# Patient Record
Sex: Female | Born: 2000
Health system: Southern US, Community
[De-identification: ages and names within clinical notes are randomized; demographics above are authoritative.]

## PROBLEM LIST (undated history)

## (undated) DIAGNOSIS — F329 Major depressive disorder, single episode, unspecified: Secondary | ICD-10-CM

## (undated) DIAGNOSIS — K5900 Constipation, unspecified: Secondary | ICD-10-CM

## (undated) DIAGNOSIS — F32A Depression, unspecified: Secondary | ICD-10-CM

## (undated) DIAGNOSIS — F9821 Rumination disorder of infancy: Secondary | ICD-10-CM

## (undated) DIAGNOSIS — F419 Anxiety disorder, unspecified: Secondary | ICD-10-CM

## (undated) HISTORY — DX: Rumination disorder of infancy and childhood: F98.21

## (undated) HISTORY — DX: Constipation, unspecified: K59.00

## (undated) HISTORY — DX: Depression, unspecified: F32.A

## (undated) HISTORY — DX: Major depressive disorder, single episode, unspecified: F32.9

## (undated) HISTORY — DX: Anxiety disorder, unspecified: F41.9

---
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
## 2021-06-03 ENCOUNTER — Other Ambulatory Visit: Payer: Self-pay | Admitting: Family

## 2021-06-03 MED ORDER — CYCLOBENZAPRINE HCL 10 MG PO TABS
ORAL_TABLET | ORAL | 0 refills | Status: DC
Start: 2021-06-03 — End: 2021-06-06

## 2021-06-06 ENCOUNTER — Other Ambulatory Visit: Payer: Self-pay

## 2021-06-06 ENCOUNTER — Ambulatory Visit (INDEPENDENT_AMBULATORY_CARE_PROVIDER_SITE_OTHER): Payer: 59 | Admitting: Pediatrics

## 2021-06-06 VITALS — Ht 65.45 in | Wt 142.0 lb

## 2021-06-06 DIAGNOSIS — Z30430 Encounter for insertion of intrauterine contraceptive device: Secondary | ICD-10-CM

## 2021-06-06 DIAGNOSIS — F4323 Adjustment disorder with mixed anxiety and depressed mood: Secondary | ICD-10-CM | POA: Diagnosis not present

## 2021-06-06 DIAGNOSIS — Z113 Encounter for screening for infections with a predominantly sexual mode of transmission: Secondary | ICD-10-CM

## 2021-06-06 DIAGNOSIS — Z3202 Encounter for pregnancy test, result negative: Secondary | ICD-10-CM

## 2021-06-06 DIAGNOSIS — Z975 Presence of (intrauterine) contraceptive device: Secondary | ICD-10-CM | POA: Insufficient documentation

## 2021-06-06 MED ORDER — LEVONORGESTREL 20 MCG/DAY IU IUD: 1.0000 | INTRAUTERINE_SYSTEM | Freq: Once | INTRAUTERINE | 0 refills | Status: DC

## 2021-06-06 MED ORDER — IBUPROFEN 200 MG PO TABS
600.0000 mg | ORAL_TABLET | Freq: Once | ORAL | Status: AC
  Administered 2021-06-06: 600 mg via ORAL

## 2021-06-06 MED ORDER — LEVONORGESTREL 20 MCG/DAY IU IUD
1.0000 | INTRAUTERINE_SYSTEM | Freq: Once | INTRAUTERINE | Status: AC
  Administered 2021-06-06: 1 via INTRAUTERINE

## 2021-06-06 MED ORDER — FLUOXETINE HCL 40 MG PO CAPS: 40.0000 mg | ORAL_CAPSULE | Freq: Every day | ORAL | 3 refills | Status: DC

## 2021-06-06 NOTE — Progress Notes (Signed)
Mirena IUD Insertion   The pt presents for Mirena IUD placement.  No contraindications for placement.   The patient took no medication prior to appt.   [facility]CG: NEG   Risks & benefits of IUD discussed  The IUD was purchased and supplied by [employer].  Packaging instructions supplied to patient  Consent form signed.  The patient denies any allergies to anesthetics or antiseptics.   Procedure:  Pt was placed in lithotomy position.  Speculum was inserted.  GC/CT swab was used to collect sample for STI testing.  Tenaculum was used to stabilize the cervix by clasping at 12 o'clock  Betadine was used to clean the cervix and cervical os.  Dilators were used. The uterus was sounded to 7 cm.  Reita Cliche was inserted using manufacturer provided applicator. Lot # [id number]  Strings were trimmed to 3 cm external to os.  Tenaculum was removed.  Speculum was removed.   The patient was advised to move slowly from a supine to an upright position   The patient denied any concerns or complaints   The patient was instructed to schedule a follow-up appt in 1 month and to call sooner if any concerns.   The patient acknowledged agreement and understanding of the plan.

## 2021-06-06 NOTE — Patient Instructions (Signed)

## 2021-06-07 LAB — POCT URINE PREGNANCY: Preg Test, Ur: NEGATIVE

## 2021-06-07 NOTE — Progress Notes (Signed)
POC urine preg resulted.

## 2021-06-08 LAB — C. TRACHOMATIS/N. GONORRHOEAE RNA
C. trachomatis RNA, TMA: NOT DETECTED
N. gonorrhoeae RNA, TMA: NOT DETECTED

## 2021-07-07 ENCOUNTER — Ambulatory Visit: Payer: 59 | Admitting: Family

## 2021-09-16 ENCOUNTER — Other Ambulatory Visit: Payer: Self-pay

## 2021-09-16 ENCOUNTER — Ambulatory Visit (HOSPITAL_COMMUNITY)
Admission: RE | Admit: 2021-09-16 | Discharge: 2021-09-16 | Disposition: A | Discharge status: Home / Self Care | Payer: 59 | Source: Ambulatory Visit | Attending: Internal Medicine | Admitting: Internal Medicine

## 2021-09-16 ENCOUNTER — Encounter (HOSPITAL_COMMUNITY): Payer: Self-pay

## 2021-09-16 VITALS — BP 126/91 | HR 82 | Temp 98.3°F | Resp 18

## 2021-09-16 DIAGNOSIS — K130 Diseases of lips: Secondary | ICD-10-CM

## 2021-09-16 DIAGNOSIS — J019 Acute sinusitis, unspecified: Secondary | ICD-10-CM | POA: Diagnosis not present

## 2021-09-16 MED ORDER — DOXYCYCLINE HYCLATE 100 MG PO CAPS: 100.0000 mg | ORAL_CAPSULE | Freq: Two times a day (BID) | ORAL | 0 refills | Status: AC

## 2021-09-16 MED ORDER — CETIRIZINE HCL 10 MG PO TABS: 10.0000 mg | ORAL_TABLET | Freq: Every day | ORAL | 0 refills | Status: AC

## 2021-09-16 MED ORDER — MUPIROCIN CALCIUM 2 % EX CREA: 1.0000 "application " | TOPICAL_CREAM | Freq: Two times a day (BID) | CUTANEOUS | 0 refills | Status: DC

## 2021-09-16 MED ORDER — MUPIROCIN CALCIUM 2 % EX CREA: TOPICAL_CREAM | Freq: Two times a day (BID) | CUTANEOUS | Status: DC

## 2021-09-16 NOTE — ED Triage Notes (Signed)
Pt is present today with cough, chest/nasal congestion, fatigue, and HA. Pt sx started x2 weeks ago.

## 2021-09-16 NOTE — Discharge Instructions (Signed)
Please take medications as prescribed Continue Flonase use Add Zyrtec to your treatment regimen Maintain adequate hydration If symptoms worsen please return to urgent care to be reevaluated.

## 2021-09-17 NOTE — ED Provider Notes (Signed)
[facility]    CSN: [id number] Arrival date & time: 09/16/21  1639      History   Chief Complaint Chief Complaint  Patient presents with   Cough   Nasal Congestion   fever blister   Headache    HPI Angelica Valencia is a 20 y.o. female comes to the urgent care with a 2-week history of nasal congestion, cough, fatigue and headache.  Patient's symptoms started 2 weeks ago when symptoms have improved except for cough, nasal congestion and fatigue.  Patient is also developed a rash on the lower lip.  Rash is surrounded by an erythematous patch.  No pain or itching.  Patient has been taking at the rash.  No abdominal pain or nausea or vomiting.  Patient lives in an apartment complex.  No history of seasonal allergies.  No wheezing.  Patient endorses nasal congestion, purulent nasal discharge as well as postnasal drip.  Patient also complains of facial pain.  No fever or chills.  HPI  Past Medical History:  Diagnosis Date   Anxiety and depression    Constipation    Disordered eating 06/01/2017   Rumination disorder     Patient Active Problem List   Diagnosis Date Noted   IUD (intrauterine device) in place 06/06/2021   Adjustment disorder with mixed anxiety and depressed mood 06/01/2017   Constipation 03/28/2016    History reviewed. No pertinent surgical history.  OB History   No obstetric history on file.      Home Medications    Prior to Admission medications   Medication Sig Start Date End Date Taking? Authorizing Provider  cetirizine (ZYRTEC ALLERGY) 10 MG tablet Take 1 tablet (10 mg total) by mouth daily. 09/16/21  Yes Macsen Nuttall, Britta Mccreedy, MD  doxycycline (VIBRAMYCIN) 100 MG capsule Take 1 capsule (100 mg total) by mouth 2 (two) times daily for 7 days. 09/16/21 09/23/21 Yes Raysa Bosak, Britta Mccreedy, MD  mupirocin cream (BACTROBAN) 2 % Apply 1 application topically 2 (two) times daily. 09/16/21  Yes Meia Emley, Britta Mccreedy, MD  b complex vitamins capsule Take 1 capsule by  mouth daily.    [provider]  FLUoxetine (PROZAC) 40 MG capsule Take 1 capsule (40 mg total) by mouth daily. 06/06/21   Owens Shark, MD  Multiple Vitamin (MULTIVITAMIN) tablet Take 1 tablet by mouth daily.    [provider]  Probiotic Product (PROBIOTIC-10 PO) Take by mouth.    [provider]    Family History Family History  Problem Relation Age of Onset   Anxiety disorder Mother    Hashimoto's thyroiditis Paternal Aunt     Social History Social History   Tobacco Use   Smoking status: Never   Smokeless tobacco: Never  Vaping Use   Vaping Use: Never used  Substance Use Topics   Alcohol use: No    Alcohol/week: 0.0 standard drinks   Drug use: No     Allergies   Penicillins   Review of Systems Review of Systems As per HPI  Physical Exam Triage Vital Signs ED Triage Vitals  Enc Vitals Group     BP 09/16/21 1735 (!) 126/91     Pulse Rate 09/16/21 1735 82     Resp 09/16/21 1735 18     Temp 09/16/21 1735 98.3 F (36.8 C)     Temp Source 09/16/21 1735 Oral     SpO2 09/16/21 1735 98 %     Weight --      Height --  Head Circumference --      Peak Flow --      Pain Score 09/16/21 1734 0     Pain Loc --      Pain Edu? --      Excl. in [place]? --    No data found.  Updated Vital Signs BP (!) 126/91 (BP Location: Right Arm)   Pulse 82   Temp 98.3 F (36.8 C) (Oral)   Resp 18   SpO2 98%   Visual Acuity Right Eye Distance:   Left Eye Distance:   Bilateral Distance:    Right Eye Near:   Left Eye Near:    Bilateral Near:     Physical Exam Vitals and nursing note reviewed.  Constitutional:      General: She is not in acute distress.    Appearance: She is well-developed. She is not ill-appearing.  Cardiovascular:     Rate and Rhythm: Normal rate and regular rhythm.     Heart sounds: Normal heart sounds. No murmur heard.   No friction rub.  Pulmonary:     Effort: Pulmonary effort is normal. No respiratory distress.      Breath sounds: No wheezing or rhonchi.  Abdominal:     General: Bowel sounds are normal.     Palpations: Abdomen is soft. There is no mass.     Tenderness: There is no abdominal tenderness.  Neurological:     Mental Status: She is alert.     GCS: GCS eye subscore is 4. GCS verbal subscore is 5. GCS motor subscore is 6.     UC Treatments / Results  Labs (all labs ordered are listed, but only abnormal results are displayed) Labs Reviewed - No data to display  EKG   Radiology No results found.  Procedures Procedures (including critical care time)  Medications Ordered in UC Medications - No data to display  Initial Impression / Assessment and Plan / UC Course  I have reviewed the triage vital signs and the nursing notes.  Pertinent labs & imaging results that were available during my care of the patient were reviewed by me and considered in my medical decision making (see chart for details).     1.  Cellulitis of the skin of lower lip: Doxycycline 100 mg twice daily for 7 days Mupirocin cream twice daily for 5 days If symptoms worsen please return to urgent care to be reevaluated  2.  Acute sinusitis with symptoms greater than 10 days: Antibiotics as above Flonase use recommended Humidifier use recommended Zyrtec once daily Return precautions given Final Clinical Impressions(s) / UC Diagnoses   Final diagnoses:  Cellulitis of skin of lip  Acute sinusitis with symptoms greater than 10 days     Discharge Instructions      Please take medications as prescribed Continue Flonase use Add Zyrtec to your treatment regimen Maintain adequate hydration If symptoms worsen please return to urgent care to be reevaluated.   ED Prescriptions     Medication Sig Dispense Auth. Provider   doxycycline (VIBRAMYCIN) 100 MG capsule Take 1 capsule (100 mg total) by mouth 2 (two) times daily for 7 days. 14 capsule Nachmen Mansel, Britta Mccreedy, MD   mupirocin cream (BACTROBAN) 2 % Apply  1 application topically 2 (two) times daily. 15 g Merrilee Jansky, MD   cetirizine (ZYRTEC ALLERGY) 10 MG tablet Take 1 tablet (10 mg total) by mouth daily. 60 tablet Erico Stan, Britta Mccreedy, MD      PDMP not reviewed this  encounter.   Merrilee Jansky, MD 09/17/21 [phone]

## 2022-02-15 ENCOUNTER — Other Ambulatory Visit: Payer: Self-pay | Admitting: Pediatrics

## 2022-02-15 DIAGNOSIS — F4323 Adjustment disorder with mixed anxiety and depressed mood: Secondary | ICD-10-CM

## 2022-02-15 MED ORDER — FLUOXETINE HCL 40 MG PO CAPS: 40.0000 mg | ORAL_CAPSULE | Freq: Every day | ORAL | 3 refills | Status: DC

## 2022-02-21 ENCOUNTER — Other Ambulatory Visit: Payer: Self-pay | Admitting: Pediatrics

## 2022-02-21 DIAGNOSIS — F4323 Adjustment disorder with mixed anxiety and depressed mood: Secondary | ICD-10-CM

## 2022-02-21 MED ORDER — FLUOXETINE HCL 40 MG PO CAPS: 40.0000 mg | ORAL_CAPSULE | Freq: Every day | ORAL | 3 refills | Status: DC

## 2022-11-27 ENCOUNTER — Other Ambulatory Visit: Payer: Self-pay

## 2022-11-27 ENCOUNTER — Ambulatory Visit (HOSPITAL_COMMUNITY)
Admission: EM | Admit: 2022-11-27 | Discharge: 2022-11-27 | Disposition: A | Discharge status: Home / Self Care | Payer: 59 | Attending: Internal Medicine | Admitting: Internal Medicine

## 2022-11-27 ENCOUNTER — Encounter (HOSPITAL_COMMUNITY): Payer: Self-pay | Admitting: *Deleted

## 2022-11-27 DIAGNOSIS — R1030 Lower abdominal pain, unspecified: Secondary | ICD-10-CM | POA: Diagnosis present

## 2022-11-27 DIAGNOSIS — N3001 Acute cystitis with hematuria: Secondary | ICD-10-CM | POA: Diagnosis present

## 2022-11-27 LAB — POC URINE PREG, ED: Preg Test, Ur: NEGATIVE

## 2022-11-27 LAB — POCT URINALYSIS DIPSTICK, ED / UC
Bilirubin Urine: NEGATIVE
Glucose, UA: NEGATIVE mg/dL
Ketones, ur: NEGATIVE mg/dL
Nitrite: NEGATIVE
Protein, ur: 100 mg/dL — AB
Specific Gravity, Urine: 1.025 (ref 1.005–1.030)
Urobilinogen, UA: 0.2 mg/dL (ref 0.0–1.0)
pH: 7 (ref 5.0–8.0)

## 2022-11-27 MED ORDER — NITROFURANTOIN MONOHYD MACRO 100 MG PO CAPS: 100.0000 mg | ORAL_CAPSULE | Freq: Two times a day (BID) | ORAL | 0 refills | Status: DC

## 2022-11-27 NOTE — Discharge Instructions (Signed)
We are treating you for urinary tract infection.  Please take nitrofurantoin twice daily for 5 days.  This can change the color of your urine to a brown so do not be alarmed if you see this.  We are sending your urine for culture and we will contact you if we need to stop or change your antibiotics.  Will also contact you with your swab results if we need to arrange any treatment.  Make sure you rest and drink plenty of fluid.  If your symptoms worsen in any way and you have abdominal pain, pelvic pain, fever, nausea, vomiting, back pain, weakness you should be seen immediately.  Follow-up with your primary care in 2 to 4 weeks to have your urine rechecked once we have treated the infection and ensure that the blood we noted today goes away once we clear the infection.

## 2022-11-27 NOTE — ED Triage Notes (Signed)
Pt reports Sx's started 2 days ago with dysuria and frequency.

## 2022-11-27 NOTE — ED Provider Notes (Signed)
Angelica Valencia    CSN: [id number] Arrival date & time: 11/27/22  1929      History   Chief Complaint Chief Complaint  Patient presents with   Dysuria   Urinary Frequency    HPI Angelica Valencia is a 22 y.o. female.   Patient presents today with a 2-day history of UTI symptoms including dysuria and urinary frequency.  She reports associated cloudy urine, malodorous urine, lower abdominal pain.  Denies any hematuria, pelvic pain, fever, nausea, vomiting.  She does report some vaginal discharge which she describes as gray.  She is no specific concern for STI but is sexually active.  She denies any recent antibiotic use.  She denies history of recurrent UTI, nephrolithiasis, single kidney, self-catheterization, recent urogenital procedure.  She has not seen a urologist in the past.  She does not believe she could be pregnant.  She denies history of diabetes and does not take SGLT2 inhibitor.    Past Medical History:  Diagnosis Date   Anxiety and depression    Constipation    Disordered eating 06/01/2017   Rumination disorder     Patient Active Problem List   Diagnosis Date Noted   IUD (intrauterine device) in place 06/06/2021   Adjustment disorder with mixed anxiety and depressed mood 06/01/2017   Constipation 03/28/2016    History reviewed. No pertinent surgical history.  OB History   No obstetric history on file.      Home Medications    Prior to Admission medications   Medication Sig Start Date End Date Taking? Authorizing Provider  nitrofurantoin, macrocrystal-monohydrate, (MACROBID) 100 MG capsule Take 1 capsule (100 mg total) by mouth 2 (two) times daily. 11/27/22  Yes Jalesia Loudenslager K, PA-C  b complex vitamins capsule Take 1 capsule by mouth daily.    [provider]  cetirizine (ZYRTEC ALLERGY) 10 MG tablet Take 1 tablet (10 mg total) by mouth daily. 09/16/21   Chase Picket, MD  FLUoxetine (PROZAC) 40 MG capsule Take 1 capsule (40 mg total) by  mouth daily. 02/21/22   Trude Mcburney, FNP  Multiple Vitamin (MULTIVITAMIN) tablet Take 1 tablet by mouth daily.    [provider]  mupirocin cream (BACTROBAN) 2 % Apply 1 application topically 2 (two) times daily. 09/16/21   LampteyMyrene Galas, MD  Probiotic Product (PROBIOTIC-10 PO) Take by mouth.    [provider]    Family History Family History  Problem Relation Age of Onset   Anxiety disorder Mother    Hashimoto's thyroiditis Paternal Aunt     Social History Social History   Tobacco Use   Smoking status: Never   Smokeless tobacco: Never  Vaping Use   Vaping Use: Never used  Substance Use Topics   Alcohol use: No    Alcohol/week: 0.0 standard drinks of alcohol   Drug use: No     Allergies   Penicillins   Review of Systems Review of Systems  Constitutional:  Positive for activity change. Negative for appetite change and fatigue.  Gastrointestinal:  Positive for abdominal pain. Negative for diarrhea, nausea and vomiting.  Genitourinary:  Positive for dysuria, frequency and vaginal discharge. Negative for hematuria, pelvic pain, urgency, vaginal bleeding and vaginal pain.     Physical Exam Triage Vital Signs ED Triage Vitals  Enc Vitals Group     BP 11/27/22 1951 (!) 147/87     Pulse Rate 11/27/22 1951 77     Resp 11/27/22 1951 18     Temp  11/27/22 1951 99.3 F (37.4 C)     Temp src --      SpO2 11/27/22 1951 96 %     Weight --      Height --      Head Circumference --      Peak Flow --      Pain Score 11/27/22 1949 8     Pain Loc --      Pain Edu? --      Excl. in [place]? --    No data found.  Updated Vital Signs BP (!) 147/87   Pulse 77   Temp 99.3 F (37.4 C)   Resp 18   LMP 11/07/2022   SpO2 96%   Visual Acuity Right Eye Distance:   Left Eye Distance:   Bilateral Distance:    Right Eye Near:   Left Eye Near:    Bilateral Near:     Physical Exam Vitals reviewed.  Constitutional:      General: She is awake. She  is not in acute distress.    Appearance: Normal appearance. She is well-developed. She is not ill-appearing.     Comments: Very pleasant female appears stated age in no acute distress sitting comfortably in exam room  HENT:     Head: Normocephalic and atraumatic.  Cardiovascular:     Rate and Rhythm: Normal rate and regular rhythm.     Heart sounds: Normal heart sounds, S1 normal and S2 normal. No murmur heard. Pulmonary:     Effort: Pulmonary effort is normal.     Breath sounds: Normal breath sounds. No wheezing, rhonchi or rales.     Comments: Clear to auscultation bilaterally Abdominal:     General: Bowel sounds are normal.     Palpations: Abdomen is soft.     Tenderness: There is no abdominal tenderness. There is no right CVA tenderness, left CVA tenderness, guarding or rebound.     Comments: Benign abdominal exam  Genitourinary:    Comments: Exam deferred Psychiatric:        Behavior: Behavior is cooperative.      UC Treatments / Results  Labs (all labs ordered are listed, but only abnormal results are displayed) Labs Reviewed  POCT URINALYSIS DIPSTICK, ED / UC - Abnormal; Notable for the following components:      Result Value   Hgb urine dipstick LARGE (*)    Protein, ur 100 (*)    Leukocytes,Ua SMALL (*)    All other components within normal limits  URINE CULTURE  POC URINE PREG, ED  CERVICOVAGINAL ANCILLARY ONLY    EKG   Radiology No results found.  Procedures Procedures (including critical care time)  Medications Ordered in UC Medications - No data to display  Initial Impression / Assessment and Plan / UC Course  I have reviewed the triage vital signs and the nursing notes.  Pertinent labs & imaging results that were available during my care of the patient were reviewed by me and considered in my medical decision making (see chart for details).     Patient is well-appearing, afebrile, nontoxic, nontachycardic.  Urine pregnancy was negative in  clinic today.  UA showed hemoglobin and leukocyte Estrace consistent with UTI.  Will empirically treat with nitrofurantoin.  Urine culture was obtained and is pending.  Discussed that we will contact her if we need to change or adjust her treatment based on sensitivities identified on culture.  STI swab was collected given associated vaginal discharge and is pending.  Discussed  that given hemoglobin and urine she should follow-up with her primary care in 2 to 4 weeks to have this rechecked and ensure that hemoglobin noted today resolved with clearing of infection.  She is to push fluids and rest.  Discussed that if she has any worsening or changing symptoms including pelvic pain, abdominal pain, fever, nausea, vomiting, hematuria, weakness she should be seen immediately.  Strict return precautions given to which she expressed understanding.  Final Clinical Impressions(s) / UC Diagnoses   Final diagnoses:  Acute cystitis with hematuria  Lower abdominal pain     Discharge Instructions      We are treating you for urinary tract infection.  Please take nitrofurantoin twice daily for 5 days.  This can change the color of your urine to a brown so do not be alarmed if you see this.  We are sending your urine for culture and we will contact you if we need to stop or change your antibiotics.  Will also contact you with your swab results if we need to arrange any treatment.  Make sure you rest and drink plenty of fluid.  If your symptoms worsen in any way and you have abdominal pain, pelvic pain, fever, nausea, vomiting, back pain, weakness you should be seen immediately.  Follow-up with your primary care in 2 to 4 weeks to have your urine rechecked once we have treated the infection and ensure that the blood we noted today goes away once we clear the infection.     ED Prescriptions     Medication Sig Dispense Auth. Provider   nitrofurantoin, macrocrystal-monohydrate, (MACROBID) 100 MG capsule Take 1  capsule (100 mg total) by mouth 2 (two) times daily. 10 capsule Harriette Tovey, Derry Skill, PA-C      PDMP not reviewed this encounter.   Terrilee Croak, PA-C 11/27/22 2012

## 2022-11-28 LAB — CERVICOVAGINAL ANCILLARY ONLY
Bacterial Vaginitis (gardnerella): NEGATIVE
Candida Glabrata: NEGATIVE
Candida Vaginitis: NEGATIVE
Chlamydia: NEGATIVE
Comment: NEGATIVE
Comment: NEGATIVE
Comment: NEGATIVE
Comment: NEGATIVE
Comment: NEGATIVE
Comment: NORMAL
Neisseria Gonorrhea: NEGATIVE
Trichomonas: NEGATIVE

## 2022-11-29 LAB — URINE CULTURE: Culture: 10000 — AB

## 2023-01-19 ENCOUNTER — Other Ambulatory Visit: Payer: Self-pay

## 2023-01-19 ENCOUNTER — Emergency Department (HOSPITAL_COMMUNITY)
Admission: EM | Admit: 2023-01-19 | Discharge: 2023-01-19 | Disposition: A | Discharge status: Home / Self Care | Payer: 59 | Attending: Emergency Medicine | Admitting: Emergency Medicine

## 2023-01-19 DIAGNOSIS — R7309 Other abnormal glucose: Secondary | ICD-10-CM | POA: Insufficient documentation

## 2023-01-19 DIAGNOSIS — F10920 Alcohol use, unspecified with intoxication, uncomplicated: Secondary | ICD-10-CM | POA: Insufficient documentation

## 2023-01-19 DIAGNOSIS — R4182 Altered mental status, unspecified: Secondary | ICD-10-CM | POA: Diagnosis present

## 2023-01-19 LAB — CBC WITH DIFFERENTIAL/PLATELET
Abs Immature Granulocytes: 0.04 10*3/uL (ref 0.00–0.07)
Basophils Absolute: 0 10*3/uL (ref 0.0–0.1)
Basophils Relative: 0 %
Eosinophils Absolute: 0.2 10*3/uL (ref 0.0–0.5)
Eosinophils Relative: 2 %
HCT: 41.1 % (ref 36.0–46.0)
Hemoglobin: 14.2 g/dL (ref 12.0–15.0)
Immature Granulocytes: 0 %
Lymphocytes Relative: 44 %
Lymphs Abs: 4.6 10*3/uL — ABNORMAL HIGH (ref 0.7–4.0)
MCH: 31.6 pg (ref 26.0–34.0)
MCHC: 34.5 g/dL (ref 30.0–36.0)
MCV: 91.3 fL (ref 80.0–100.0)
Monocytes Absolute: 0.7 10*3/uL (ref 0.1–1.0)
Monocytes Relative: 7 %
Neutro Abs: 4.8 10*3/uL (ref 1.7–7.7)
Neutrophils Relative %: 47 %
Platelets: 339 10*3/uL (ref 150–400)
RBC: 4.5 MIL/uL (ref 3.87–5.11)
RDW: 11.9 % (ref 11.5–15.5)
WBC: 10.4 10*3/uL (ref 4.0–10.5)
nRBC: 0 % (ref 0.0–0.2)

## 2023-01-19 LAB — I-STAT BETA HCG BLOOD, ED (MC, WL, AP ONLY): I-stat hCG, quantitative: <5 m[IU]/mL (ref <5)

## 2023-01-19 LAB — COMPREHENSIVE METABOLIC PANEL
ALT: 16 U/L (ref 0–44)
AST: 24 U/L (ref 15–41)
Albumin: 4.5 g/dL (ref 3.5–5.0)
Alkaline Phosphatase: 44 U/L (ref 38–126)
Anion gap: 14 (ref 5–15)
BUN: 8 mg/dL (ref 6–20)
CO2: 22 mmol/L (ref 22–32)
Calcium: 9.1 mg/dL (ref 8.9–10.3)
Chloride: 103 mmol/L (ref 98–111)
Creatinine, Ser: 0.74 mg/dL (ref 0.44–1.00)
GFR, Estimated: >60 mL/min (ref >60)
Glucose, Bld: 111 mg/dL — ABNORMAL HIGH (ref 70–99)
Potassium: 3.6 mmol/L (ref 3.5–5.1)
Sodium: 139 mmol/L (ref 135–145)
Total Bilirubin: 0.7 mg/dL (ref 0.3–1.2)
Total Protein: 7.8 g/dL (ref 6.5–8.1)

## 2023-01-19 LAB — SALICYLATE LEVEL: Salicylate Lvl: <7 mg/dL — ABNORMAL LOW (ref 7.0–30.0)

## 2023-01-19 LAB — I-STAT CHEM 8, ED
BUN: 8 mg/dL (ref 6–20)
Calcium, Ion: 1.06 mmol/L — ABNORMAL LOW (ref 1.15–1.40)
Chloride: 106 mmol/L (ref 98–111)
Creatinine, Ser: 1.2 mg/dL — ABNORMAL HIGH (ref 0.44–1.00)
Glucose, Bld: 108 mg/dL — ABNORMAL HIGH (ref 70–99)
HCT: 41 % (ref 36.0–46.0)
Hemoglobin: 13.9 g/dL (ref 12.0–15.0)
Potassium: 3.6 mmol/L (ref 3.5–5.1)
Sodium: 143 mmol/L (ref 135–145)
TCO2: 22 mmol/L (ref 22–32)

## 2023-01-19 LAB — RAPID URINE DRUG SCREEN, HOSP PERFORMED
Amphetamines: NOT DETECTED
Barbiturates: NOT DETECTED
Benzodiazepines: NOT DETECTED
Cocaine: UNDETERMINED — AB
Opiates: NOT DETECTED
Tetrahydrocannabinol: POSITIVE — AB

## 2023-01-19 LAB — ACETAMINOPHEN LEVEL: Acetaminophen (Tylenol), Serum: <10 ug/mL — ABNORMAL LOW (ref 10–30)

## 2023-01-19 LAB — ETHANOL: Alcohol, Ethyl (B): 381 mg/dL (ref <10)

## 2023-01-19 MED ORDER — NALOXONE HCL 2 MG/2ML IJ SOSY
PREFILLED_SYRINGE | INTRAMUSCULAR | Status: AC
  Administered 2023-01-19: 2 mg via INTRAVENOUS
  Filled 2023-01-19: qty 2

## 2023-01-19 MED ORDER — NALOXONE HCL 2 MG/2ML IJ SOSY: 2.0000 mg | PREFILLED_SYRINGE | Freq: Once | INTRAMUSCULAR | Status: AC

## 2023-01-19 MED ORDER — SODIUM CHLORIDE 0.9 % IV BOLUS
1000.0000 mL | Freq: Once | INTRAVENOUS | Status: AC
  Administered 2023-01-19: 1000 mL via INTRAVENOUS

## 2023-01-19 NOTE — ED Provider Notes (Addendum)
[facility] Provider Note   CSN: [id number] Arrival date & time: 01/19/23  0147     History  No chief complaint on file.   Kristyn Liaw is a 22 y.o. female.  History provided by: Triage nurse. The history is limited by the condition of the patient (unresponsive).  She has history of anxiety and depression and was dropped off at triage by friends who immediately left, patient was unresponsive and unable to give any history.   Home Medications Prior to Admission medications   Medication Sig Start Date End Date Taking? Authorizing Provider  b complex vitamins capsule Take 1 capsule by mouth daily.    [provider]  cetirizine (ZYRTEC ALLERGY) 10 MG tablet Take 1 tablet (10 mg total) by mouth daily. 09/16/21   Chase Picket, MD  FLUoxetine (PROZAC) 40 MG capsule Take 1 capsule (40 mg total) by mouth daily. 02/21/22   Trude Mcburney, FNP  Multiple Vitamin (MULTIVITAMIN) tablet Take 1 tablet by mouth daily.    [provider]  mupirocin cream (BACTROBAN) 2 % Apply 1 application topically 2 (two) times daily. 09/16/21   LampteyMyrene Galas, MD  nitrofurantoin, macrocrystal-monohydrate, (MACROBID) 100 MG capsule Take 1 capsule (100 mg total) by mouth 2 (two) times daily. 11/27/22   Raspet, Derry Skill, PA-C  Probiotic Product (PROBIOTIC-10 PO) Take by mouth.    [provider]      Allergies    Penicillins    Review of Systems   Review of Systems  Unable to perform ROS: Patient unresponsive    Physical Exam Updated Vital Signs BP 104/69 (BP Location: Right Arm)   Pulse 91   Temp 97.9 F (36.6 C) (Temporal)   Resp 14   SpO2 96%  Physical Exam Vitals and nursing note reviewed.   22 year old female only responsive to painful stimuli. Vital signs are normal. Oxygen saturation is 96%, which is normal. Head is normocephalic and atraumatic.  Pupils are 7 mm and reactive. Oropharynx is clear. Neck is supple  without adenopathy or JVD. Lungs are clear without rales, wheezes, or rhonchi. Chest moves symmetrically. Heart has regular rate and rhythm without murmur. Abdomen is soft, flat. Extremities have no cyanosis or edema, full range of motion is present. Skin is warm and dry without rash. Neurologic: Responds to painful stimuli with somewhat purposeful movement, moves all extremities equally.  ED Results / Procedures / Treatments   Labs (all labs ordered are listed, but only abnormal results are displayed) Labs Reviewed  COMPREHENSIVE METABOLIC PANEL - Abnormal; Notable for the following components:      Result Value   Glucose, Bld 111 (*)    All other components within normal limits  ETHANOL - Abnormal; Notable for the following components:   Alcohol, Ethyl (B) 381 (*)    All other components within normal limits  CBC WITH DIFFERENTIAL/PLATELET - Abnormal; Notable for the following components:   Lymphs Abs 4.6 (*)    All other components within normal limits  RAPID URINE DRUG SCREEN, HOSP PERFORMED - Abnormal; Notable for the following components:   Cocaine QUANTITY NOT SUFFICIENT, UNABLE TO PERFORM TEST (*)    Tetrahydrocannabinol POSITIVE (*)    All other components within normal limits  SALICYLATE LEVEL - Abnormal; Notable for the following components:   Salicylate Lvl [id number] (*)    All other components within normal limits  ACETAMINOPHEN LEVEL - Abnormal; Notable for the following components:   Acetaminophen (  Tylenol), Serum <10 (*)    All other components within normal limits  I-STAT CHEM 8, ED - Abnormal; Notable for the following components:   Creatinine, Ser 1.20 (*)    Glucose, Bld 108 (*)    Calcium, Ion 1.06 (*)    All other components within normal limits  I-STAT BETA HCG BLOOD, ED ([facility], [facility], [facility] ONLY)    EKG EKG Interpretation  Date/Time:  Friday January 19 2023 01:55:31 EDT Ventricular Rate:  74 PR Interval:  131 QRS Duration: 76 QT Interval:  387 QTC  Calculation: 430 R Axis:   84 Text Interpretation: Sinus rhythm Normal ECG No old tracing to compare Confirmed by Delora Fuel ([id number]) on 01/19/2023 2:05:13 AM  Procedures Procedures  Cardiac monitor shows normal sinus rhythm, per my interpretation.  Medications Ordered in ED Medications  naloxone [facility]) injection 2 mg (2 mg Intravenous Given 01/19/23 0204)  sodium chloride 0.9 % bolus 1,000 mL (1,000 mLs Intravenous New Bag/Given 01/19/23 0204)    ED Course/ Medical Decision Making/ A&P                             Medical Decision Making Amount and/or Complexity of Data Reviewed Labs: ordered.  Risk Prescription drug management.   Altered mental status in a otherwise healthy young person.  Dilated pupils make opiate overdose unlikely.  Likely other intoxicants such as alcohol.  When she was challenged with ammonia and how it, she woke up and started sneezing.  She did have a episode where she seemed to have brief apnea with hypoxia, but respirations resumed spontaneously and she has been maintaining good oxygen saturation since then.  I have reviewed her past records, and see no relevant past visits.  I have ordered IV fluids, laboratory workup including CBC, comprehensive metabolic panel, ethanol level, acetaminophen level, salicylate level, urine drug screen, pregnancy test.  Even though pupils were dilated, I did order a dose of naloxone as a trial, there was no response to that.  I have reviewed and interpreted her laboratory test, and my interpretation is mild elevation of random glucose which will need to be followed as an outpatient, markedly elevated ethanol level consistent with alcohol intoxication, urine drug screen positive for THC.  Clinically, patient had severe alcohol intoxication.  I have observed her in the emergency department and she has gradually become awake and conversant although still obviously intoxicated.  Family has arrived and feel comfortable taking her home.   She has been able to ambulate without any difficulty.  I have advised her to limit her alcohol consumption.  CRITICAL CARE Performed by: Delora Fuel Total critical care time: 45 minutes Critical care time was exclusive of separately billable procedures and treating other patients. Critical care was necessary to treat or prevent imminent or life-threatening deterioration. Critical care was time spent personally by me on the following activities: development of treatment plan with patient and/or surrogate as well as nursing, discussions with consultants, evaluation of patient's response to treatment, examination of patient, obtaining history from patient or surrogate, ordering and performing treatments and interventions, ordering and review of laboratory studies, ordering and review of radiographic studies, pulse oximetry and re-evaluation of patient's condition.  Final Clinical Impression(s) / ED Diagnoses Final diagnoses:  Alcohol intoxication, uncomplicated ([place])  Elevated random blood glucose level    Rx / DC Orders ED Discharge Orders     None         Roxanne Mins,  Shanon Brow, MD [id number]    Delora Fuel, MD [phone]

## 2023-01-19 NOTE — Discharge Instructions (Addendum)
You had way too much alcohol to drink tonight.  Please limit your alcohol to no more than 2 drinks in a day.  1 drink is a shot of whiskey, glass of wine, or a can of beer.

## 2023-01-19 NOTE — ED Notes (Signed)
Pt a/o x 4 at this time. GCS 15

## 2023-01-19 NOTE — ED Triage Notes (Signed)
Pt arrives to ED POV dropped off unresponsive

## 2023-01-19 NOTE — ED Notes (Signed)
Pt able to ambulate with no difficulty

## 2023-05-21 ENCOUNTER — Ambulatory Visit: Payer: 59 | Admitting: Internal Medicine

## 2023-05-21 ENCOUNTER — Encounter: Payer: Self-pay | Admitting: Internal Medicine

## 2023-05-21 VITALS — BP 112/80 | HR 72 | Temp 99.0°F | Ht 65.45 in | Wt 133.0 lb

## 2023-05-21 DIAGNOSIS — F4323 Adjustment disorder with mixed anxiety and depressed mood: Secondary | ICD-10-CM | POA: Diagnosis not present

## 2023-05-21 DIAGNOSIS — Z13220 Encounter for screening for lipoid disorders: Secondary | ICD-10-CM | POA: Diagnosis not present

## 2023-05-21 DIAGNOSIS — Z975 Presence of (intrauterine) contraceptive device: Secondary | ICD-10-CM | POA: Diagnosis not present

## 2023-05-21 DIAGNOSIS — Z113 Encounter for screening for infections with a predominantly sexual mode of transmission: Secondary | ICD-10-CM

## 2023-05-21 DIAGNOSIS — Z0000 Encounter for general adult medical examination without abnormal findings: Secondary | ICD-10-CM

## 2023-05-21 LAB — LIPID PANEL
Cholesterol: 232 mg/dL — ABNORMAL HIGH (ref 0–200)
HDL: 72.3 mg/dL (ref >39.00)
LDL Cholesterol: 139 mg/dL — ABNORMAL HIGH (ref 0–99)
NonHDL: 159.2
Total CHOL/HDL Ratio: 3
Triglycerides: 103 mg/dL (ref 0.0–149.0)
VLDL: 20.6 mg/dL (ref 0.0–40.0)

## 2023-05-21 LAB — CBC
HCT: 43.7 % (ref 36.0–46.0)
Hemoglobin: 14.9 g/dL (ref 12.0–15.0)
MCHC: 34 g/dL (ref 30.0–36.0)
MCV: 93.7 fl (ref 78.0–100.0)
Platelets: 375 10*3/uL (ref 150.0–400.0)
RBC: 4.67 Mil/uL (ref 3.87–5.11)
RDW: 12.5 % (ref 11.5–15.5)
WBC: 7.9 10*3/uL (ref 4.0–10.5)

## 2023-05-21 LAB — COMPREHENSIVE METABOLIC PANEL
ALT: 19 U/L (ref 0–35)
AST: 27 U/L (ref 0–37)
Albumin: 5 g/dL (ref 3.5–5.2)
Alkaline Phosphatase: 48 U/L (ref 39–117)
BUN: 13 mg/dL (ref 6–23)
CO2: 26 mEq/L (ref 19–32)
Calcium: 10.3 mg/dL (ref 8.4–10.5)
Chloride: 99 mEq/L (ref 96–112)
Creatinine, Ser: 0.72 mg/dL (ref 0.40–1.20)
GFR: 119.57 mL/min (ref >60.00)
Glucose, Bld: 91 mg/dL (ref 70–99)
Potassium: 3.7 mEq/L (ref 3.5–5.1)
Sodium: 134 mEq/L — ABNORMAL LOW (ref 135–145)
Total Bilirubin: 1.4 mg/dL — ABNORMAL HIGH (ref 0.2–1.2)
Total Protein: 8.2 g/dL (ref 6.0–8.3)

## 2023-05-21 MED ORDER — FLUOXETINE HCL 40 MG PO CAPS: 40.0000 mg | ORAL_CAPSULE | Freq: Every day | ORAL | 3 refills | Status: DC

## 2023-05-21 NOTE — Assessment & Plan Note (Signed)
Doing well and controlled with prozac 40 mg daily. We will continue and she does not desire change to dose at this time. Checking CMP and CBC and lipid panel for screening.

## 2023-05-21 NOTE — Progress Notes (Signed)
   Subjective:   Patient ID: Angelica Valencia, female    DOB: 09-22-2001, 22 y.o.   MRN: [id number]  HPI The patient is a new 22 YO female coming in for follow up care (see A/P for details) and desires physical as well.   PMH, [facility], social history reviewed and updated  Review of Systems  Constitutional: Negative.   HENT: Negative.    Eyes: Negative.   Respiratory:  Negative for cough, chest tightness and shortness of breath.   Cardiovascular:  Negative for chest pain, palpitations and leg swelling.  Gastrointestinal:  Negative for abdominal distention, abdominal pain, constipation, diarrhea, nausea and vomiting.  Musculoskeletal: Negative.   Skin: Negative.   Neurological: Negative.   Psychiatric/Behavioral: Negative.      Objective:  Physical Exam Constitutional:      Appearance: She is well-developed.  HENT:     Head: Normocephalic and atraumatic.  Cardiovascular:     Rate and Rhythm: Normal rate and regular rhythm.  Pulmonary:     Effort: Pulmonary effort is normal. No respiratory distress.     Breath sounds: Normal breath sounds. No wheezing or rales.  Abdominal:     General: Bowel sounds are normal. There is no distension.     Palpations: Abdomen is soft.     Tenderness: There is no abdominal tenderness. There is no rebound.  Musculoskeletal:     Cervical back: Normal range of motion.  Skin:    General: Skin is warm and dry.  Neurological:     Mental Status: She is alert and oriented to person, place, and time.     Coordination: Coordination normal.     Vitals:   05/21/23 0906  BP: 112/80  Pulse: 72  Temp: 99 F (37.2 C)  TempSrc: Oral  SpO2: 96%  Weight: 133 lb (60.3 kg)  Height: 5' 5.45" (1.662 m)    Assessment & Plan:

## 2023-05-21 NOTE — Assessment & Plan Note (Signed)
Flu shot yearly. Tetanus up to date checking records. Pap smear due she will get ob/gyn. Counseled about sun safety and mole surveillance. Counseled about the dangers of distracted driving. Given 10 year screening recommendations.

## 2023-05-21 NOTE — Assessment & Plan Note (Signed)
Reviewed mirena 2022 placed and 7 year life for this. She will need ob/gyn to remove when indicated or desired. Offered STI screening and checking for this today. No symptoms and no new partners.

## 2023-09-25 NOTE — Progress Notes (Signed)
 [facility] 09/25/23  Treatment Providers Selinda Benne, DDS Shawnee Herald RDH, BS  Dental procedures in this visit  . D0150 - COMPREHENSIVE ORAL EVALUATION - NEW OR ESTABLISHED PATIENT  . I9779 - SINGLE X-RAY  . I9769 - ADDITIONAL X-RAY  . I9769 - ADDITIONAL X-RAY  . I9669 - PANORAMIC RADIOGRAPHIC IMAGE  . I9725 - BITEWINGS - FOUR RADIOGRAPHIC IMAGES  Adult Prophy completed  HEALTH HISTORY ? Vitals:  BP Readings from Last 1 Encounters:  09/25/23 118/80    Pulse:  79 Medical history was reviewed and updated. No contraindication to care. History reviewed. No pertinent past medical history. History reviewed. No pertinent surgical history. Social History   Tobacco Use  . Smoking status: Never  . Smokeless tobacco: Not on file  Substance Use Topics  . Alcohol use: Never   No family history on file. Current Outpatient Medications on File Prior to Visit  Medication Sig Dispense Refill  . FLUoxetine  (PROzac ) 40 mg capsule Take 40 mg by mouth.    SABRA b complex vitamins (Vitamins B Complex) capsule Take 1 capsule by mouth 1 (one) time each day.    . bisacodyL (DULCOLAX) 5 mg EC tablet Take 5 mg by mouth every other day if needed.    . multivit-min-folic ac-caffeine 100 mcg- 22.5 mg tablet,chewable Chew 1 tablet 1 (one) time each day.     No current facility-administered medications on file prior to visit.   Medical Risk Assessment ASA GRADE: ASA 1 - Normal health patient  Subjective: [name] is a 22 y.o. female who presents for initial consultation with the chief concern (CC): I am looking to go back to the dentist and get into the routine. It has been maybe a year and half ago since last dental visit. I have no pain or sensitivity.  ?Pain score currently: @PAINSCORE @0   Objective:  Radiographs taken: 2-4 BWs and selected PAs: 6, 7, 8, 9, 10, 11, 23, 24, 25, and 26  ROS: all other systems negative for symptoms  Extraoral Examination? wnl Patient is  well appearing, alert and oriented to time and place.  Patient appeared well nourished and in no distress.  There was no facial asymmetry, swelling or lymphadenopathy.  There was no hoarseness.  The temporomandibular joint examination was within normal limits without tenderness.  Palpation of the masseter, temporalis, sternocleidomastoid and trapezius muscles was within normal limits.  Muscles of facial expression within normal limits.  There was normal mouth opening and range of motion.  The skin of the head and neck was within normal limits.   Intraoral Examination? wnl There were no mucosal lesions or swelling of the oropharynx, hard palate, soft palate, buccal mucosa, lip mucosa, gingiva, tongue, or floor of mouth.  Major salivary glands palpated and ducts were patent.  There was adequate saliva with moist mucosa and floor of mouth pooling.   Periodontal Findings Periodontal Charting: Updated full mouth periodontal charting and documented all findings in patient's tooth chart: Yes 1-35mm periodontal pocketing with localized 4mm in the posteriors. Recession noted, if present. localized BOP noted: see chart No exudate present No recession noted CAL: 1-3 mm  Furcation involvements: none Mobility:  none Calculus: light_supragingival plaque and calculus  Color- pink Consistency: pink and stippled Radiographic studies shows wnl   Dental Findings Dental Exam   Imaging None Lab results None Additional observations: Lingual permanent retainer on #7- #10 and #22- #27. #8- DIFL chip, patient opted to continue to monitor    Assessment:  Diagnosis: After thorough clinical and radiographic examination, the patient was advised of the following diagnosis:  healthy hard/soft tissues   Risks and potential complications: wnl. Periodontal diagnosis: Healthy, within normal limits  Plan: Treatment options: no treatment, none recommended prophylaxis  Treatment options related to findings  reviewed and all questions from patient answered.  Pt opts for treatment option(s): #2, patient stayed for cleaning today and was completed by Shawnee Herald RDH, BS.  Discussion with patient to address risks and concerns.  Discussed with patient home care and hygiene recommendations: Patient was encouraged to floss more frequently. Currently using Waterpik on occasional basis stressed daily use facial/ lingual; pt does not like to use floss threaders due to difficulty/ discomfort 8/9, 7/8 area, gave super floss to see if can function better than threaders and floss separate Patient expressed understanding of all that was discussed.  PRESCRIPTION: none  NV: Recall/maintenance  Treatment Providers Selinda Benne, DDS Shawnee Herald RDH, BS Bernice Merck, DA [facility] [address] [place] [address] [phone]

## 2024-06-19 ENCOUNTER — Encounter (HOSPITAL_COMMUNITY): Payer: Self-pay | Admitting: Emergency Medicine

## 2024-06-19 ENCOUNTER — Emergency Department (HOSPITAL_COMMUNITY)
Admission: EM | Admit: 2024-06-19 | Discharge: 2024-06-20 | Disposition: A | Discharge status: Home / Self Care | Attending: Emergency Medicine | Admitting: Emergency Medicine

## 2024-06-19 ENCOUNTER — Other Ambulatory Visit: Payer: Self-pay

## 2024-06-19 DIAGNOSIS — Y907 Blood alcohol level of 200-239 mg/100 ml: Secondary | ICD-10-CM | POA: Insufficient documentation

## 2024-06-19 DIAGNOSIS — F1010 Alcohol abuse, uncomplicated: Secondary | ICD-10-CM | POA: Insufficient documentation

## 2024-06-19 LAB — RAPID URINE DRUG SCREEN, HOSP PERFORMED
Amphetamines: NOT DETECTED
Barbiturates: NOT DETECTED
Benzodiazepines: NOT DETECTED
Cocaine: NOT DETECTED
Opiates: NOT DETECTED
Tetrahydrocannabinol: POSITIVE — AB

## 2024-06-19 LAB — CBC
HCT: 39.3 % (ref 36.0–46.0)
Hemoglobin: 13.3 g/dL (ref 12.0–15.0)
MCH: 32 pg (ref 26.0–34.0)
MCHC: 33.8 g/dL (ref 30.0–36.0)
MCV: 94.7 fL (ref 80.0–100.0)
Platelets: 301 K/uL (ref 150–400)
RBC: 4.15 MIL/uL (ref 3.87–5.11)
RDW: 12.6 % (ref 11.5–15.5)
WBC: 6.9 K/uL (ref 4.0–10.5)
nRBC: 0 % (ref 0.0–0.2)

## 2024-06-19 LAB — COMPREHENSIVE METABOLIC PANEL WITH GFR
ALT: 18 U/L (ref 0–44)
AST: 33 U/L (ref 15–41)
Albumin: 4.3 g/dL (ref 3.5–5.0)
Alkaline Phosphatase: 31 U/L — ABNORMAL LOW (ref 38–126)
Anion gap: 13 (ref 5–15)
BUN: 5 mg/dL — ABNORMAL LOW (ref 6–20)
CO2: 23 mmol/L (ref 22–32)
Calcium: 9.3 mg/dL (ref 8.9–10.3)
Chloride: 105 mmol/L (ref 98–111)
Creatinine, Ser: 0.68 mg/dL (ref 0.44–1.00)
GFR, Estimated: >60 mL/min (ref >60)
Glucose, Bld: 86 mg/dL (ref 70–99)
Potassium: 3.9 mmol/L (ref 3.5–5.1)
Sodium: 141 mmol/L (ref 135–145)
Total Bilirubin: 0.8 mg/dL (ref 0.0–1.2)
Total Protein: 7.5 g/dL (ref 6.5–8.1)

## 2024-06-19 LAB — ETHANOL: Alcohol, Ethyl (B): 223 mg/dL — ABNORMAL HIGH (ref <15)

## 2024-06-19 LAB — HCG, SERUM, QUALITATIVE: Preg, Serum: NEGATIVE
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
## 2024-06-20 NOTE — Discharge Instructions (Addendum)
 Can call the local centers to get this set up.  Take copies of your labs, they will need this information. Return here for any new/acute changes.

## 2024-06-20 NOTE — ED Provider Notes (Signed)
 [place] EMERGENCY DEPARTMENT AT [facility] Provider Note   CSN: [id number] Arrival date & time: 06/19/24  1940     Patient presents with: Alcohol Problem   Angelica Valencia is a 23 y.o. female.   The history is provided by the patient and medical records.  Alcohol Problem   23 year old female brought into the ED with parents for assistance in alcohol detox.  Sounds like over the past few years she has been binge drinking as a coping mechanism in times of high stress.  Was recently established with a therapist which seems to be working as well.  She has had some legal issues related to a few DUIs in the past.  Parents are just very concerned about her behaviors and potential self-destruction.  Patient does admit that she feels like her drinking is becoming a problem.  She does drink to excess and blacks out on occasion.  This is not a daily occurrence.  Last drink was yesterday around 4 PM.  She states when not drinking she feels fine and does not experience any tremors, sweating, or seizure activity.  She has never done inpatient or outpatient detox program previously.  Denies any SI/HI/AVH.  Prior to Admission medications   Medication Sig Start Date End Date Taking? Authorizing Provider  b complex vitamins capsule Take 1 capsule by mouth daily.    [provider]  cetirizine  (ZYRTEC  ALLERGY) 10 MG tablet Take 1 tablet (10 mg total) by mouth daily. 09/16/21   LampteyAleene KIDD, MD  FLUoxetine  (PROZAC ) 40 MG capsule Take 1 capsule (40 mg total) by mouth daily. 05/21/23   Rollene Almarie LABOR, MD  Multiple Vitamin (MULTIVITAMIN) tablet Take 1 tablet by mouth daily.    [provider]  Probiotic Product (PROBIOTIC-10 PO) Take by mouth.    [provider]    Allergies: Penicillins    Review of Systems  Psychiatric/Behavioral:         Detox  All other systems reviewed and are negative.   Updated Vital Signs BP 129/79 (BP Location: Right Arm)    Pulse 90   Temp 98.3 F (36.8 C) (Oral)   Resp 16   Wt 61.2 kg   LMP 06/05/2024   SpO2 100%   BMI 22.16 kg/m   Physical Exam Vitals and nursing note reviewed.  Constitutional:      Appearance: She is well-developed.  HENT:     Head: Normocephalic and atraumatic.  Eyes:     Conjunctiva/sclera: Conjunctivae normal.     Pupils: Pupils are equal, round, and reactive to light.  Cardiovascular:     Rate and Rhythm: Normal rate and regular rhythm.     Heart sounds: Normal heart sounds.  Pulmonary:     Effort: Pulmonary effort is normal.     Breath sounds: Normal breath sounds.  Abdominal:     General: Bowel sounds are normal.     Palpations: Abdomen is soft.  Musculoskeletal:        General: Normal range of motion.     Cervical back: Normal range of motion.  Skin:    General: Skin is warm and dry.  Neurological:     Mental Status: She is alert and oriented to person, place, and time.     Comments: Awake, alert, oriented x 3, no tremors or seizure activity witnessed  Psychiatric:     Comments: No SI/HI/AVH     (all labs ordered are listed, but only abnormal results are displayed) Labs Reviewed  COMPREHENSIVE METABOLIC PANEL WITH GFR - Abnormal; Notable for the following components:      Result Value   BUN 5 (*)    Alkaline Phosphatase 31 (*)    All other components within normal limits  ETHANOL - Abnormal; Notable for the following components:   Alcohol, Ethyl (B) 223 (*)    All other components within normal limits  RAPID URINE DRUG SCREEN, HOSP PERFORMED - Abnormal; Notable for the following components:   Tetrahydrocannabinol POSITIVE (*)    All other components within normal limits  CBC  HCG, SERUM, QUALITATIVE    EKG: None  Radiology: No results found.   Procedures   Medications Ordered in the ED - No data to display                                  Medical Decision Making Amount and/or Complexity of Data Reviewed Labs: ordered. ECG/medicine  tests: ordered and independent interpretation performed.   23 year old female presenting to the ED for assistance with alcohol detox.  Has been binge drinking for a while now as coping mechanism in times of stress.  Does not have any withdrawal symptoms when not drinking.  Last drink was yesterday around 4 PM.  She is awake, alert, oriented here.  She is hemodynamically stable. No tremors or seizure activity observed.  Does not appear to have any acute signs of withdrawal and denies experiencing this previously.  Labs without leukocytosis or electrolyte derangement.  LFTs are normal.  Ethanol is 223.  UDS positive for THC.  Patient adamantly denies any SI/HI/AVH.  Discussed with patient and parents options for inpatient versus outpatient treatment.  We have discussed possible Librium taper, however patient does not feel this is necessary as she has never experienced any withdrawal symptoms in the past.  I have given them resource guide for assistance to help set up treatment as well as copies of her labs today for her pre entry medical evaluation.  Can follow-up with PCP in the interim.  Return here for any new or acute changes.  Final diagnoses:  Alcohol abuse    ED Discharge Orders     None          Jarold Olam HERO, PA-C 06/20/24 0156    Carita Senior, MD 06/20/24 [phone]

## 2024-08-13 ENCOUNTER — Other Ambulatory Visit: Payer: Self-pay | Admitting: Internal Medicine

## 2024-08-13 DIAGNOSIS — F4323 Adjustment disorder with mixed anxiety and depressed mood: Secondary | ICD-10-CM

## 2024-08-18 ENCOUNTER — Telehealth: Admitting: Family Medicine

## 2024-08-18 DIAGNOSIS — B9689 Other specified bacterial agents as the cause of diseases classified elsewhere: Secondary | ICD-10-CM

## 2024-08-18 DIAGNOSIS — J019 Acute sinusitis, unspecified: Secondary | ICD-10-CM

## 2024-08-18 MED ORDER — AZITHROMYCIN 250 MG PO TABS: ORAL_TABLET | ORAL | 0 refills | Status: AC

## 2024-08-18 NOTE — Patient Instructions (Addendum)
  Schuyler Race, thank you for joining Chiquita CHRISTELLA Barefoot, NP for today's virtual visit.  While this provider is not your primary care provider (PCP), if your PCP is located in our provider database this encounter information will be shared with them immediately following your visit.   A [facility] MyChart account gives you access to today's visit and all your visits, tests, and labs performed at [facility]  click here if you don't have a [facility] MyChart account or go to [email or web address]  Consent: (Patient) Angelica Valencia provided verbal consent for this virtual visit at the beginning of the encounter.  Current Medications:  Current Outpatient Medications:    azithromycin (ZITHROMAX) 250 MG tablet, Take 2 tablets on day 1, then 1 tablet daily on days 2 through 5, Disp: 6 tablet, Rfl: 0   b complex vitamins capsule, Take 1 capsule by mouth daily., Disp: , Rfl:    cetirizine  (ZYRTEC  ALLERGY) 10 MG tablet, Take 1 tablet (10 mg total) by mouth daily., Disp: 60 tablet, Rfl: 0   FLUoxetine  (PROZAC ) 40 MG capsule, Take 1 capsule (40 mg total) by mouth daily., Disp: 90 capsule, Rfl: 3   Multiple Vitamin (MULTIVITAMIN) tablet, Take 1 tablet by mouth daily., Disp: , Rfl:    Probiotic Product (PROBIOTIC-10 PO), Take by mouth., Disp: , Rfl:    Medications ordered in this encounter:  Meds ordered this encounter  Medications   azithromycin (ZITHROMAX) 250 MG tablet    Sig: Take 2 tablets on day 1, then 1 tablet daily on days 2 through 5    Dispense:  6 tablet    Refill:  0    Supervising Provider:   LAMPTEY, PHILIP O [phone]     *If you need refills on other medications prior to your next appointment, please contact your pharmacy*  Follow-Up: Call back or seek an in-person evaluation if the symptoms worsen or if the condition fails to improve as anticipated.  [facility] [phone]  Other Instructions   - Increased rest - Increasing Fluids -  Acetaminophen  / ibuprofen  as needed for fever/pain.  - Saline nasal spray if congestion or if nasal passages feel dry. - Humidifying the air.   If you have been instructed to have an in-person evaluation today at a local Urgent Care facility, please use the link below. It will take you to a list of all of our available [facility], including address, phone number and hours of operation. Please do not delay care.  [facility]  If you or a family member do not have a primary care provider, use the link below to schedule a visit and establish care. When you choose a [facility] primary care physician or advanced practice provider, you gain a long-term partner in health. Find a Primary Care Provider  Learn more about [facility]'s in-office and virtual care options: [facility] - Get Care Now

## 2024-08-18 NOTE — Progress Notes (Signed)
 Virtual Visit Consent   Angelica Valencia, you are scheduled for a virtual visit with a [facility] provider today. Just as with appointments in the office, your consent must be obtained to participate. Your consent will be active for this visit and any virtual visit you may have with one of our providers in the next 365 days. If you have a MyChart account, a copy of this consent can be sent to you electronically.  As this is a virtual visit, video technology does not allow for your provider to perform a traditional examination. This may limit your provider's ability to fully assess your condition. If your provider identifies any concerns that need to be evaluated in person or the need to arrange testing (such as labs, EKG, etc.), we will make arrangements to do so. Although advances in technology are sophisticated, we cannot ensure that it will always work on either your end or our end. If the connection with a video visit is poor, the visit may have to be switched to a telephone visit. With either a video or telephone visit, we are not always able to ensure that we have a secure connection.  By engaging in this virtual visit, you consent to the provision of healthcare and authorize for your insurance to be billed (if applicable) for the services provided during this visit. Depending on your insurance coverage, you may receive a charge related to this service.  I need to obtain your verbal consent now. Are you willing to proceed with your visit today? Angelica Valencia has provided verbal consent on 08/18/2024 for a virtual visit (video or telephone). Chiquita CHRISTELLA Barefoot, NP  Date: 08/18/2024 1:26 PM   Virtual Visit via Video Note   I, Chiquita CHRISTELLA Barefoot, connected with  Angelica Valencia  ([id number], Feb 28, 2001) on 08/18/24 at  1:30 PM EDT by a video-enabled telemedicine application and verified that I am speaking with the correct person using two identifiers.  Location: Patient: Virtual Visit Location Patient:  Home Provider: Virtual Visit Location Provider: Home Office   I discussed the limitations of evaluation and management by telemedicine and the availability of in person appointments. The patient expressed understanding and agreed to proceed.    History of Present Illness: Angelica Valencia is a 23 y.o. who identifies as a female who was assigned female at birth, and is being seen today for cold symptoms   Onset was 2 weeks of congestion, runny nose, and at times mucus would be yellow, but was not too bad until this past weekend with night sweat and chills Associated symptoms are fever of 101 2 days ago, with increased congestion, sore throat pain, swelling under chin to throat gland area. Work up this morning with sweats, and feeling the feverish and chills off and on this morning. 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 Both mom and dad  have allergic reactions- swelling and rash   Medications:  Current Outpatient Medications:    b complex vitamins capsule, Take 1 capsule by mouth daily., Disp: , Rfl:    cetirizine  (ZYRTEC  ALLERGY) 10 MG tablet, Take 1 tablet (10 mg total) by mouth daily., Disp: 60 tablet, Rfl: 0   FLUoxetine  (PROZAC ) 40 MG capsule, Take 1 capsule (40 mg total) by mouth daily., Disp: 90 capsule, Rfl: 3   Multiple Vitamin (MULTIVITAMIN) tablet, Take 1  tablet by mouth daily., Disp: , Rfl:    Probiotic Product (PROBIOTIC-10 PO), Take by mouth., Disp: , Rfl:   Observations/Objective: Patient is well-developed, well-nourished in no acute distress.  Resting comfortably  at home.  Head is normocephalic, atraumatic.  No labored breathing.  Speech is clear and coherent with logical content.  Patient is alert and oriented at baseline.    Assessment and Plan:  1. Acute bacterial sinusitis (Primary)  - azithromycin (ZITHROMAX) 250 MG tablet; Take 2 tablets on day 1, then 1 tablet daily on days 2 through 5  Dispense: 6 tablet; Refill: 0    - Increased rest - Increasing Fluids - Acetaminophen  / ibuprofen  as needed for fever/pain.  - Saline nasal spray if congestion or if nasal passages feel dry. - Humidifying the air.    Reviewed side effects, risks and benefits of medication.    Patient acknowledged agreement and understanding of the plan.   Past Medical, Surgical, Social History, Allergies, and Medications have been Reviewed.     Follow Up Instructions: I discussed the assessment and treatment plan with the patient. The patient was provided an opportunity to ask questions and all were answered. The patient agreed with the plan and demonstrated an understanding of the instructions.  A copy of instructions were sent to the patient via MyChart unless otherwise noted below.    The patient was advised to call back or seek an in-person evaluation if the symptoms worsen or if the condition fails to improve as anticipated.    Chiquita CHRISTELLA Barefoot, NP

## 2024-08-20 ENCOUNTER — Other Ambulatory Visit: Payer: Self-pay | Admitting: Internal Medicine

## 2024-08-20 DIAGNOSIS — F4323 Adjustment disorder with mixed anxiety and depressed mood: Secondary | ICD-10-CM

## 2024-08-21 ENCOUNTER — Telehealth: Payer: Self-pay

## 2024-08-21 NOTE — Telephone Encounter (Signed)
 Copied from CRM [id number]. Topic: Clinical - Medication Question >> Aug 21, 2024  9:14 AM Angelica Valencia wrote: Reason for CRM: Patient called in regarding FLUoxetine  (PROZAC ) 40 MG capsule refill informed patient that she will need to scheduled and be seen by provider before prescription will be sent, Patient wanted to know if there was anyway for the prescription to still be sent as she works in [place] and it hard to get back to be scheduled, would like for someone to give her a callback regarding this

## 2024-08-21 NOTE — Telephone Encounter (Signed)
 Patient scheduled appointment for 11/4@1pm .

## 2024-08-26 ENCOUNTER — Ambulatory Visit: Admitting: Internal Medicine

## 2024-09-02 ENCOUNTER — Ambulatory Visit: Admitting: Internal Medicine

## 2024-09-02 VITALS — BP 130/80 | HR 78 | Temp 98.8°F | Ht 65.45 in | Wt 132.0 lb

## 2024-09-02 DIAGNOSIS — Z13220 Encounter for screening for lipoid disorders: Secondary | ICD-10-CM | POA: Diagnosis not present

## 2024-09-02 DIAGNOSIS — Z23 Encounter for immunization: Secondary | ICD-10-CM | POA: Diagnosis not present

## 2024-09-02 DIAGNOSIS — Z0000 Encounter for general adult medical examination without abnormal findings: Secondary | ICD-10-CM

## 2024-09-02 DIAGNOSIS — F4323 Adjustment disorder with mixed anxiety and depressed mood: Secondary | ICD-10-CM | POA: Diagnosis not present

## 2024-09-02 DIAGNOSIS — Z0001 Encounter for general adult medical examination with abnormal findings: Secondary | ICD-10-CM | POA: Diagnosis not present

## 2024-09-02 LAB — COMPREHENSIVE METABOLIC PANEL WITH GFR
ALT: 21 U/L (ref 0–35)
AST: 29 U/L (ref 0–37)
Albumin: 4.8 g/dL (ref 3.5–5.2)
Alkaline Phosphatase: 44 U/L (ref 39–117)
BUN: 9 mg/dL (ref 6–23)
CO2: 28 meq/L (ref 19–32)
Calcium: 9.8 mg/dL (ref 8.4–10.5)
Chloride: 101 meq/L (ref 96–112)
Creatinine, Ser: 0.59 mg/dL (ref 0.40–1.20)
GFR: 127.72 mL/min (ref >60.00)
Glucose, Bld: 76 mg/dL (ref 70–99)
Potassium: 3.6 meq/L (ref 3.5–5.1)
Sodium: 139 meq/L (ref 135–145)
Total Bilirubin: 0.9 mg/dL (ref 0.2–1.2)
Total Protein: 8 g/dL (ref 6.0–8.3)

## 2024-09-02 LAB — LIPID PANEL
Cholesterol: 247 mg/dL — ABNORMAL HIGH (ref 0–200)
HDL: 79.7 mg/dL (ref >39.00)
LDL Cholesterol: 141 mg/dL — ABNORMAL HIGH (ref 0–99)
NonHDL: 167.44
Total CHOL/HDL Ratio: 3
Triglycerides: 131 mg/dL (ref 0.0–149.0)
VLDL: 26.2 mg/dL (ref 0.0–40.0)

## 2024-09-02 LAB — CBC
HCT: 41 % (ref 36.0–46.0)
Hemoglobin: 14.3 g/dL (ref 12.0–15.0)
MCHC: 34.9 g/dL (ref 30.0–36.0)
MCV: 92.4 fl (ref 78.0–100.0)
Platelets: 332 K/uL (ref 150.0–400.0)
RBC: 4.44 Mil/uL (ref 3.87–5.11)
RDW: 12.8 % (ref 11.5–15.5)
WBC: 10.1 K/uL (ref 4.0–10.5)

## 2024-09-02 MED ORDER — FLUOXETINE HCL 20 MG PO CAPS: 20.0000 mg | ORAL_CAPSULE | Freq: Every day | ORAL | 3 refills | Status: AC

## 2024-09-02 MED ORDER — FLUOXETINE HCL 40 MG PO CAPS: 40.0000 mg | ORAL_CAPSULE | Freq: Every day | ORAL | 3 refills | Status: AC

## 2024-09-02 NOTE — Progress Notes (Unsigned)
 Subjective:   Patient ID: Angelica Valencia, female    DOB: 2001/09/22, 23 y.o.   MRN: [id number]  The patient is here for physical. Pertinent topics discussed: Discussed the use of AI scribe software for clinical note transcription with the patient, who gave verbal consent to proceed.  History of Present Illness Angelica Valencia is a 23 year old female who presents for follow-up regarding her Prozac  medication management.  She has been taking Prozac  for several years and is considering a dosage adjustment due to recent life changes and stressors. Currently, she is on a stable dose of 40 mg but is contemplating an increase to 60 mg. She discussed options for increasing the dose, including taking a combination of 40 mg and 20 mg or three 20 mg pills. She is in therapy, which has brought up past issues, and she is managing her mental health with medication and therapy.  No new chest pains, breathing issues, or gastrointestinal problems such as diarrhea, constipation, or heartburn. She experiences muscle soreness, which she attributes to frequent gym workouts, and stress-related neck tension, which she associates with sleep issues.  She has an IUD in place with no issues such as bleeding or pain. She is interested in STD/STI screening and routine blood work to establish a baseline.  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 She received a flu shot and is aware of the increased exposure to germs, taking precautions to protect her health.  She has a family history of melanoma, as her mother had it, and she is diligent about using sunscreen. She has a longstanding mole on her head.  She does not smoke and has been mindful of her alcohol consumption, having previously used it as a coping mechanism. She feels she is in a better place now and is not currently using alcohol as a coping  strategy.  PMH, [facility], social history reviewed and updated  Review of Systems  Constitutional: Negative.   HENT: Negative.    Eyes: Negative.   Respiratory:  Negative for cough, chest tightness and shortness of breath.   Cardiovascular:  Negative for chest pain, palpitations and leg swelling.  Gastrointestinal:  Negative for abdominal distention, abdominal pain, constipation, diarrhea, nausea and vomiting.  Musculoskeletal: Negative.   Skin: Negative.   Neurological: Negative.   Psychiatric/Behavioral: Negative.      Objective:  Physical Exam Constitutional:      Appearance: She is well-developed.  HENT:     Head: Normocephalic and atraumatic.  Cardiovascular:     Rate and Rhythm: Normal rate and regular rhythm.  Pulmonary:     Effort: Pulmonary effort is normal. No respiratory distress.     Breath sounds: Normal breath sounds. No wheezing or rales.  Abdominal:     General: Bowel sounds are normal. There is no distension.     Palpations: Abdomen is soft.     Tenderness: There is no abdominal tenderness.  Musculoskeletal:     Cervical back: Normal range of motion.  Skin:    General: Skin is warm and dry.  Neurological:     Mental Status: She is alert and oriented to person, place, and time.     Coordination: Coordination normal.     Vitals:   09/02/24 1412  BP: 130/80  Pulse: 78  Temp: 98.8 F (37.1 C)  TempSrc: Oral  SpO2: 99%  Weight: 132 lb (59.9 kg)  Height: 5'  5.45 (1.662 m)    Assessment & Plan:  Flu shot given at visit

## 2024-09-02 NOTE — Patient Instructions (Addendum)
 We will go to 60 mg of prozac 

## 2024-09-03 LAB — RPR: RPR Ser Ql: NONREACTIVE

## 2024-09-03 LAB — HIV ANTIBODY (ROUTINE TESTING W REFLEX)
HIV 1&2 Ab, 4th Generation: NONREACTIVE
HIV FINAL INTERPRETATION: NEGATIVE

## 2024-09-04 NOTE — Assessment & Plan Note (Signed)
 Not well controlled and will increase prozac  to 60 mg daily new rx done. Asked to let us  know in 4 weeks how this is doing.

## 2024-09-04 NOTE — Assessment & Plan Note (Signed)
 Flu shot given. Tetanus up to date, pap smear up to date with gyn. Counseled about sun safety and mole surveillance. Counseled about the dangers of distracted driving. Given 10 year screening recommendations.

## 2024-09-05 ENCOUNTER — Ambulatory Visit: Payer: Self-pay | Admitting: Internal Medicine

## 2024-09-05 LAB — GC/CHLAMYDIA PROBE AMP
Chlamydia trachomatis, NAA: NEGATIVE
Neisseria Gonorrhoeae by PCR: NEGATIVE

## 2024-09-06 ENCOUNTER — Encounter: Payer: Self-pay | Admitting: Internal Medicine
